# Patient Record
Sex: Female | Born: 1950 | Race: White | Hispanic: No | Marital: Married | Smoking: Never smoker
Health system: Southern US, Community
[De-identification: ages and names within clinical notes are randomized; demographics above are authoritative.]

## PROBLEM LIST (undated history)

## (undated) DIAGNOSIS — G43109 Migraine with aura, not intractable, without status migrainosus: Secondary | ICD-10-CM

## (undated) HISTORY — DX: Migraine with aura, not intractable, without status migrainosus: G43.109

## (undated) HISTORY — PX: CHOLECYSTECTOMY: SHX55

## (undated) HISTORY — PX: BACK SURGERY: SHX140

---

## 1983-01-15 HISTORY — PX: TUBAL LIGATION: SHX77

## 1999-11-07 ENCOUNTER — Encounter: Admission: RE | Admit: 1999-11-07 | Discharge: 1999-11-07 | Payer: Self-pay

## 1999-11-12 ENCOUNTER — Encounter: Admission: RE | Admit: 1999-11-12 | Discharge: 1999-11-12 | Payer: Self-pay

## 2001-01-01 ENCOUNTER — Encounter: Admission: RE | Admit: 2001-01-01 | Discharge: 2001-01-01 | Payer: Self-pay

## 2002-02-22 ENCOUNTER — Encounter: Admission: RE | Admit: 2002-02-22 | Discharge: 2002-02-22 | Payer: Self-pay

## 2003-03-28 ENCOUNTER — Encounter: Admission: RE | Admit: 2003-03-28 | Discharge: 2003-03-28 | Payer: Self-pay | Admitting: Obstetrics & Gynecology

## 2004-10-08 ENCOUNTER — Encounter: Admission: RE | Admit: 2004-10-08 | Discharge: 2004-10-08 | Payer: Self-pay | Admitting: Obstetrics & Gynecology

## 2005-10-16 ENCOUNTER — Encounter: Admission: RE | Admit: 2005-10-16 | Discharge: 2005-10-16 | Payer: Self-pay | Admitting: Obstetrics & Gynecology

## 2006-12-02 ENCOUNTER — Encounter: Admission: RE | Admit: 2006-12-02 | Discharge: 2006-12-02 | Payer: Self-pay | Admitting: Obstetrics & Gynecology

## 2007-12-03 ENCOUNTER — Encounter: Admission: RE | Admit: 2007-12-03 | Discharge: 2007-12-03 | Payer: Self-pay | Admitting: Obstetrics & Gynecology

## 2008-06-08 ENCOUNTER — Encounter: Admission: RE | Admit: 2008-06-08 | Discharge: 2008-06-08 | Payer: Self-pay | Admitting: Obstetrics & Gynecology

## 2008-12-05 ENCOUNTER — Encounter: Admission: RE | Admit: 2008-12-05 | Discharge: 2008-12-05 | Payer: Self-pay | Admitting: Obstetrics & Gynecology

## 2010-01-04 ENCOUNTER — Encounter
Admission: RE | Admit: 2010-01-04 | Discharge: 2010-01-04 | Payer: Self-pay | Source: Home / Self Care | Attending: Obstetrics & Gynecology | Admitting: Obstetrics & Gynecology

## 2010-02-04 ENCOUNTER — Encounter: Payer: Self-pay | Admitting: Obstetrics & Gynecology

## 2011-02-20 ENCOUNTER — Other Ambulatory Visit: Payer: Self-pay | Admitting: Obstetrics & Gynecology

## 2011-02-20 DIAGNOSIS — Z1231 Encounter for screening mammogram for malignant neoplasm of breast: Secondary | ICD-10-CM

## 2011-03-05 ENCOUNTER — Ambulatory Visit
Admission: RE | Admit: 2011-03-05 | Discharge: 2011-03-05 | Disposition: A | Discharge status: Home / Self Care | Payer: 59 | Source: Ambulatory Visit | Attending: Obstetrics & Gynecology | Admitting: Obstetrics & Gynecology

## 2011-03-05 DIAGNOSIS — Z1231 Encounter for screening mammogram for malignant neoplasm of breast: Secondary | ICD-10-CM

## 2012-03-04 ENCOUNTER — Other Ambulatory Visit: Payer: Self-pay | Admitting: Obstetrics & Gynecology

## 2012-03-04 DIAGNOSIS — Z1231 Encounter for screening mammogram for malignant neoplasm of breast: Secondary | ICD-10-CM

## 2012-03-31 ENCOUNTER — Ambulatory Visit: Payer: 59

## 2012-04-23 ENCOUNTER — Ambulatory Visit: Payer: 59

## 2012-05-18 ENCOUNTER — Ambulatory Visit: Payer: 59

## 2012-06-22 ENCOUNTER — Ambulatory Visit: Payer: 59

## 2012-07-21 ENCOUNTER — Ambulatory Visit
Admission: RE | Admit: 2012-07-21 | Discharge: 2012-07-21 | Disposition: A | Discharge status: Home / Self Care | Payer: 59 | Source: Ambulatory Visit | Attending: Obstetrics & Gynecology | Admitting: Obstetrics & Gynecology

## 2012-07-21 DIAGNOSIS — Z1231 Encounter for screening mammogram for malignant neoplasm of breast: Secondary | ICD-10-CM

## 2014-02-14 ENCOUNTER — Other Ambulatory Visit: Payer: Self-pay

## 2014-02-14 DIAGNOSIS — Z1231 Encounter for screening mammogram for malignant neoplasm of breast: Secondary | ICD-10-CM

## 2014-02-16 ENCOUNTER — Ambulatory Visit
Admission: RE | Admit: 2014-02-16 | Discharge: 2014-02-16 | Disposition: A | Discharge status: Home / Self Care | Payer: 59 | Source: Ambulatory Visit

## 2014-02-16 DIAGNOSIS — Z1231 Encounter for screening mammogram for malignant neoplasm of breast: Secondary | ICD-10-CM

## 2015-08-01 ENCOUNTER — Other Ambulatory Visit: Payer: Self-pay | Admitting: Obstetrics and Gynecology

## 2015-08-01 DIAGNOSIS — Z1231 Encounter for screening mammogram for malignant neoplasm of breast: Secondary | ICD-10-CM

## 2015-08-08 ENCOUNTER — Ambulatory Visit
Admission: RE | Admit: 2015-08-08 | Discharge: 2015-08-08 | Disposition: A | Discharge status: Home / Self Care | Payer: 59 | Source: Ambulatory Visit | Attending: Obstetrics and Gynecology | Admitting: Obstetrics and Gynecology

## 2015-08-08 DIAGNOSIS — Z1231 Encounter for screening mammogram for malignant neoplasm of breast: Secondary | ICD-10-CM

## 2016-11-11 ENCOUNTER — Other Ambulatory Visit: Payer: Self-pay | Admitting: Obstetrics and Gynecology

## 2016-11-11 DIAGNOSIS — Z1231 Encounter for screening mammogram for malignant neoplasm of breast: Secondary | ICD-10-CM

## 2016-12-03 ENCOUNTER — Ambulatory Visit: Payer: 59

## 2016-12-09 ENCOUNTER — Ambulatory Visit: Payer: 59

## 2016-12-09 ENCOUNTER — Ambulatory Visit
Admission: RE | Admit: 2016-12-09 | Discharge: 2016-12-09 | Disposition: A | Discharge status: Home / Self Care | Payer: 59 | Source: Ambulatory Visit | Attending: Obstetrics and Gynecology | Admitting: Obstetrics and Gynecology

## 2016-12-09 DIAGNOSIS — Z1231 Encounter for screening mammogram for malignant neoplasm of breast: Secondary | ICD-10-CM

## 2017-11-17 ENCOUNTER — Other Ambulatory Visit: Payer: Self-pay | Admitting: Emergency Medicine

## 2017-11-17 DIAGNOSIS — Z1231 Encounter for screening mammogram for malignant neoplasm of breast: Secondary | ICD-10-CM

## 2017-12-29 ENCOUNTER — Ambulatory Visit: Payer: 59

## 2018-02-02 ENCOUNTER — Ambulatory Visit
Admission: RE | Admit: 2018-02-02 | Discharge: 2018-02-02 | Disposition: A | Discharge status: Home / Self Care | Payer: Medicare HMO | Source: Ambulatory Visit | Attending: Emergency Medicine | Admitting: Emergency Medicine

## 2018-02-02 DIAGNOSIS — Z1231 Encounter for screening mammogram for malignant neoplasm of breast: Secondary | ICD-10-CM

## 2019-02-12 ENCOUNTER — Other Ambulatory Visit: Payer: Self-pay | Admitting: Emergency Medicine

## 2019-02-12 DIAGNOSIS — Z1231 Encounter for screening mammogram for malignant neoplasm of breast: Secondary | ICD-10-CM

## 2019-03-24 ENCOUNTER — Ambulatory Visit: Payer: Medicare HMO

## 2019-05-26 ENCOUNTER — Ambulatory Visit
Admission: RE | Admit: 2019-05-26 | Discharge: 2019-05-26 | Disposition: A | Discharge status: Home / Self Care | Payer: Medicare HMO | Source: Ambulatory Visit | Attending: Emergency Medicine | Admitting: Emergency Medicine

## 2019-05-26 ENCOUNTER — Other Ambulatory Visit: Payer: Self-pay

## 2019-05-26 DIAGNOSIS — Z1231 Encounter for screening mammogram for malignant neoplasm of breast: Secondary | ICD-10-CM

## 2020-03-20 ENCOUNTER — Other Ambulatory Visit: Payer: Self-pay

## 2020-03-20 ENCOUNTER — Ambulatory Visit (INDEPENDENT_AMBULATORY_CARE_PROVIDER_SITE_OTHER): Payer: Medicare HMO | Admitting: Allergy and Immunology

## 2020-03-20 ENCOUNTER — Encounter: Payer: Self-pay | Admitting: Allergy and Immunology

## 2020-03-20 ENCOUNTER — Encounter (INDEPENDENT_AMBULATORY_CARE_PROVIDER_SITE_OTHER): Payer: Self-pay

## 2020-03-20 VITALS — BP 142/82 | HR 76 | Resp 16 | Ht 69.4 in | Wt 202.2 lb

## 2020-03-20 DIAGNOSIS — K219 Gastro-esophageal reflux disease without esophagitis: Secondary | ICD-10-CM

## 2020-03-20 DIAGNOSIS — J301 Allergic rhinitis due to pollen: Secondary | ICD-10-CM | POA: Diagnosis not present

## 2020-03-20 DIAGNOSIS — J3089 Other allergic rhinitis: Secondary | ICD-10-CM

## 2020-03-20 MED ORDER — OMEPRAZOLE 40 MG PO CPDR
DELAYED_RELEASE_CAPSULE | ORAL | 5 refills | Status: AC
Start: 2020-03-20 — End: ?

## 2020-03-20 NOTE — Patient Instructions (Addendum)
  1.  Allergen avoidance measures - dust mite, cat, dog, pollens  2.  Consider a course of immunotherapy  3.  Treat and prevent inflammation:   A. OTC Flonase Sensimist -1 spray each nostril 1 time per day  4.  Treat and prevent reflux/LPR:   A.  Consolidate caffeine and chocolate as much as possible  B.  Omeprazole 40 mg - 1 tablet 2 times per day  5.  If needed:   A.  Nasal ipratropium 0.06% -2 sprays each nostril every 6 hours  B. Cetirizine 10 mg - 1 tablet 1 time per day  6.  Return to clinic in 4 weeks or earlier if problem

## 2020-03-20 NOTE — Progress Notes (Signed)
[place] - [place] - [place] - [place] - [place]   NEW PATIENT NOTE  Referring Provider: No ref. provider found Primary Provider: Venetia Constable, MD Date of office visit: 03/20/2020    Subjective:   Chief Complaint:  Adriana Perez (DOB: 11-Dec-1950) is a 70 y.o. female who presents to the clinic on 03/20/2020 with a chief complaint of Allergic Rhinitis  .  HPI: Adriana Perez presents to this clinic in evaluation of several issues.  She has a long history of nasal congestion and occasional sneezing and lots of drainage in her nose.  She has been under the care of ENT for many years and it was suggested that she consider a course of immunotherapy.  Because of the logistical issue she has come to our clinic to be skin tested anticipation of starting this form of treatment.  She has lots of mucus in her throat and she has throat clearing.  She has lots of coughing after eating.  Apparently she has had a barium swallow which did not identify any significant abnormality.  She has apparently been treated with omeprazole in the past although she cannot remember the details of that issue.  Now she uses omeprazole just a few times per month whenever she has "bad heartburn" that comes up into her mid chest with discomfort.  She drinks tea about 3 times per week and has chocolate about twice a week and has red wine about 1 time per week.  Currently she had a "cold" that started about 14 days ago.  She just finished a course of antibiotics which really did not affect this issue.  She has been given nasal ipratropium which she uses about twice a day to dry up her nose which she does think helps with some of the mucus production that goes down her throat but she still continues to have problems with her throat.  She relates a history of being stung by approximately 200 yellow jackets on a single occasion over a decade ago.  She did not have any immediate reaction but later that night she  became somewhat sick with what sounds like a toxic reaction to the venom.  She has obtained 2 Moderna Covid vaccines.  Past Medical History:  Diagnosis Date  . Ocular migraine     Past Surgical History:  Procedure Laterality Date  . BACK SURGERY     Lumbar  . CESAREAN SECTION  1985  . CHOLECYSTECTOMY    . TUBAL LIGATION  1985    Allergies as of 03/20/2020      Reactions   Penicillins Shortness Of Breath, Rash      Medication List      ASPIRIN 81 PO Take by mouth daily.   bisoprolol-hydrochlorothiazide 2.5-6.25 MG tablet Commonly known as: ZIAC Take 0.5 tablets by mouth daily.   cetirizine 10 MG tablet Commonly known as: ZYRTEC Take 10 mg by mouth daily.   COQ10 PO Take by mouth daily.   doxycycline 100 MG tablet Commonly known as: VIBRA-TABS Take 100 mg by mouth 2 (two) times daily.   ipratropium 0.06 % nasal spray Commonly known as: ATROVENT Place 2 sprays into both nostrils 3 (three) times daily.   multivitamin tablet Take 1 tablet by mouth daily.   phentermine 37.5 MG tablet Commonly known as: ADIPEX-P Take 37.5 mg by mouth daily.   RED YEAST RICE PO Take by mouth.   VITAMIN B-12 PO Take 5,000 mcg by mouth daily.   VITAMIN D3 PO  Take by mouth daily.       Review of systems negative except as noted in HPI / PMHx or noted below:  Review of Systems  Constitutional: Negative.   HENT: Negative.   Eyes: Negative.   Respiratory: Negative.   Cardiovascular: Negative.   Gastrointestinal: Negative.   Genitourinary: Negative.   Musculoskeletal: Negative.   Skin: Negative.   Neurological: Negative.   Endo/Heme/Allergies: Negative.   Psychiatric/Behavioral: Negative.     Family History  Problem Relation Age of Onset  . Dementia Mother   . COPD Father   . Atrial fibrillation Father   . Heart attack Maternal Grandmother   . Heart attack Maternal Grandfather   . Heart attack Paternal Grandmother   . Heart attack Paternal Grandfather      Social History   Socioeconomic History  . Marital status: Married    Spouse name: Not on file  . Number of children: Not on file  . Years of education: Not on file  . Highest education level: Not on file  Occupational History  . Not on file  Tobacco Use  . Smoking status: Never Smoker  . Smokeless tobacco: Never Used  Substance and Sexual Activity  . Alcohol use: Not Currently  . Drug use: Never  . Sexual activity: Not on file  Other Topics Concern  . Not on file  Social History Narrative  . Not on file   Environmental and Social history  Lives in a house with a dry environment, no animals located inside the household, carpet in the bedroom, plastic on the bed, no plastic on the pillow, and no smoking ongoing with inside the household.  Objective:   Vitals:   03/20/20 0956  BP: (!) 142/82  Pulse: 76  Resp: 16  SpO2: 96%   Height: 5' 9.4" (176.3 cm) Weight: 202 lb 3.2 oz (91.7 kg)  Physical Exam Constitutional:      Appearance: She is not diaphoretic.  HENT:     Head: Normocephalic.     Right Ear: Tympanic membrane, ear canal and external ear normal.     Left Ear: Tympanic membrane, ear canal and external ear normal.     Nose: Nose normal. No mucosal edema or rhinorrhea.     Mouth/Throat:     Mouth: Oropharynx is clear and moist and mucous membranes are normal.     Pharynx: Uvula midline. No oropharyngeal exudate.  Eyes:     Conjunctiva/sclera: Conjunctivae normal.  Neck:     Thyroid: No thyromegaly.     Trachea: Trachea normal. No tracheal tenderness or tracheal deviation.  Cardiovascular:     Rate and Rhythm: Normal rate and regular rhythm.     Heart sounds: Normal heart sounds, S1 normal and S2 normal. No murmur heard.   Pulmonary:     Effort: No respiratory distress.     Breath sounds: Normal breath sounds. No stridor. No wheezing or rales.  Musculoskeletal:        General: No edema.  Lymphadenopathy:     Head:     Right side of head: No  tonsillar adenopathy.     Left side of head: No tonsillar adenopathy.     Cervical: No cervical adenopathy.  Skin:    Findings: No erythema or rash.     Nails: There is no clubbing.  Neurological:     Mental Status: She is alert.     Diagnostics: Allergy skin tests were performed.  She demonstrated hypersensitivity to dust mite, cat, dog, grass,  weed, tree.  Assessment and Plan:    1. Perennial allergic rhinitis   2. Seasonal allergic rhinitis due to pollen   3. LPRD (laryngopharyngeal reflux disease)     1.  Allergen avoidance measures - dust mite, cat, dog, pollens  2.  Consider a course of immunotherapy  3.  Treat and prevent inflammation:   A. OTC Flonase Sensimist -1 spray each nostril 1 time per day  4.  Treat and prevent reflux/LPR:   A.  Consolidate caffeine and chocolate as much as possible  B.  Omeprazole 40 mg - 1 tablet 2 times per day  5.  If needed:   A.  Nasal ipratropium 0.06% -2 sprays each nostril every 6 hours  B. Cetirizine 10 mg - 1 tablet 1 time per day  6.  Return to clinic in 4 weeks or earlier if problem  It sounds as though Pammy has a combination of atopic respiratory disease and LPR and I am going to treat her with the therapy noted above which includes allergen avoidance measures, the use of a anti-inflammatory nasal steroid on a preventative basis, and a more aggressive therapy directed against her reflux.  I will see her back in this clinic in 4 weeks or earlier if there is a problem.  Laurette Schimke, MD Allergy / Immunology [facility]

## 2020-03-21 ENCOUNTER — Encounter: Payer: Self-pay | Admitting: Allergy and Immunology

## 2020-04-17 ENCOUNTER — Ambulatory Visit: Payer: Medicare HMO | Admitting: Allergy and Immunology

## 2020-04-17 ENCOUNTER — Other Ambulatory Visit: Payer: Self-pay

## 2020-04-17 ENCOUNTER — Encounter: Payer: Self-pay | Admitting: Allergy and Immunology

## 2020-04-17 VITALS — BP 140/86 | HR 97 | Resp 16

## 2020-04-17 DIAGNOSIS — K219 Gastro-esophageal reflux disease without esophagitis: Secondary | ICD-10-CM

## 2020-04-17 DIAGNOSIS — J3089 Other allergic rhinitis: Secondary | ICD-10-CM | POA: Diagnosis not present

## 2020-04-17 DIAGNOSIS — J301 Allergic rhinitis due to pollen: Secondary | ICD-10-CM | POA: Diagnosis not present

## 2020-04-17 NOTE — Patient Instructions (Addendum)
  1.  Continue to perform allergen avoidance measures - dust mite, cat, dog, pollens  2.  Continue to treat and prevent inflammation:   A. OTC Flonase Sensimist -1 spray each nostril 1 time per day  3.  Continue to treat and prevent reflux/LPR:   A. Omeprazole 40 mg - 1 tablet 1 time per day  4.  If needed:   A.  Nasal ipratropium 0.06% -2 sprays each nostril every 6 hours  B. Cetirizine 10 mg - 1 tablet 1 time per day  5. Call clinic with update in 8 weeks. Taper???  6.  Return to clinic in 6 months or earlier if problem

## 2020-04-17 NOTE — Progress Notes (Signed)
[place] - [place] - [place] - [place] - [place]   Follow-up Note  Referring Provider: Hollace Kinnier* Primary Provider: Venetia Constable, MD Date of Office Visit: 04/17/2020  Subjective:   Adriana Perez (DOB: Jul 30, 1950) is a 70 y.o. female who [facility] in re-evaluation of the following:  HPI: Amaya returns to this clinic in evaluation of allergic rhinoconjunctivitis and LPR.  Her last visit to this clinic was her initial evaluation of 20 March 2020.  She has noticed a very significant improvement regarding all of the mucus in her throat and her throat clearing and she does not cough after eating and she has very little issues with sneezing.  Overall she is very satisfied with the response that she has received on her current therapy which includes treatment directed against respiratory tract inflammation and LPR.  She did have an issue using omeprazole twice a day.  They apparently created some sternal sensation as though it was heartburn.  At 1 time per day use she does not have any issues.  Allergies as of 04/17/2020      Reactions   Penicillins Shortness Of Breath, Rash      Medication List      ASPIRIN 81 PO Take by mouth daily.   bisoprolol-hydrochlorothiazide 2.5-6.25 MG tablet Commonly known as: ZIAC Take 0.5 tablets by mouth daily.   cetirizine 10 MG tablet Commonly known as: ZYRTEC Take 10 mg by mouth daily.   COQ10 PO Take by mouth daily.   doxycycline 100 MG tablet Commonly known as: VIBRA-TABS Take 100 mg by mouth 2 (two) times daily.   ipratropium 0.06 % nasal spray Commonly known as: ATROVENT Place 2 sprays into both nostrils 3 (three) times daily.   multivitamin tablet Take 1 tablet by mouth daily.   omeprazole 40 MG capsule Commonly known as: PRILOSEC Take one capsule by mouth twice daily.   phentermine 37.5 MG tablet Commonly known as: ADIPEX-P Take 37.5 mg by mouth  daily.   RED YEAST RICE PO Take by mouth.   VITAMIN B-12 PO Take 5,000 mcg by mouth daily.   VITAMIN D3 PO Take by mouth daily.       Past Medical History:  Diagnosis Date  . Ocular migraine     Past Surgical History:  Procedure Laterality Date  . BACK SURGERY     Lumbar  . CESAREAN SECTION  1985  . CHOLECYSTECTOMY    . TUBAL LIGATION  1985    Review of systems negative except as noted in HPI / PMHx or noted below:  Review of Systems  Constitutional: Negative.   HENT: Negative.   Eyes: Negative.   Respiratory: Negative.   Cardiovascular: Negative.   Gastrointestinal: Negative.   Genitourinary: Negative.   Musculoskeletal: Negative.   Skin: Negative.   Neurological: Negative.   Endo/Heme/Allergies: Negative.   Psychiatric/Behavioral: Negative.      Objective:   Vitals:   04/17/20 1608  BP: 140/86  Pulse: 97  Resp: 16  SpO2: 96%          Physical Exam Constitutional:      Appearance: She is not diaphoretic.  HENT:     Head: Normocephalic.     Right Ear: Tympanic membrane, ear canal and external ear normal.     Left Ear: Tympanic membrane, ear canal and external ear normal.     Nose: Nose normal. No mucosal edema or rhinorrhea.     Mouth/Throat:  Pharynx: Uvula midline. No oropharyngeal exudate.  Eyes:     Conjunctiva/sclera: Conjunctivae normal.  Neck:     Thyroid: No thyromegaly.     Trachea: Trachea normal. No tracheal tenderness or tracheal deviation.  Cardiovascular:     Rate and Rhythm: Normal rate and regular rhythm.     Heart sounds: Normal heart sounds, S1 normal and S2 normal. No murmur heard.   Pulmonary:     Effort: No respiratory distress.     Breath sounds: Normal breath sounds. No stridor. No wheezing or rales.  Lymphadenopathy:     Head:     Right side of head: No tonsillar adenopathy.     Left side of head: No tonsillar adenopathy.     Cervical: No cervical adenopathy.  Skin:    Findings: No erythema or rash.      Nails: There is no clubbing.  Neurological:     Mental Status: She is alert.     Diagnostics: none  Assessment and Plan:   1. Perennial allergic rhinitis   2. Seasonal allergic rhinitis due to pollen   3. LPRD (laryngopharyngeal reflux disease)     1.  Continue to perform allergen avoidance measures - dust mite, cat, dog, pollens  2.  Continue to treat and prevent inflammation:   A. OTC Flonase Sensimist -1 spray each nostril 1 time per day  3.  Continue to treat and prevent reflux/LPR:   A. Omeprazole 40 mg - 1 tablet 1 time per day  4.  If needed:   A.  Nasal ipratropium 0.06% -2 sprays each nostril every 6 hours  B. Cetirizine 10 mg - 1 tablet 1 time per day  5. Call clinic with update in 8 weeks. Taper???  6.  Return to clinic in 6 months or earlier if problem  [name] is doing much better even though she is right in the middle of the pollination season.  I think that the her improvement comes with consistent use of her nasal steroid and her proton pump inhibitor to address the 2 major insults contributing to her respiratory tract issues.  We will keep her on this plan for 8 weeks.  That will be a total of 12 weeks of therapy and I have asked her to contact me by telephone with an update regarding her response and we may be able to consolidate some of her treatment with that visit.  If she fails medical therapy she would be a candidate for immunotherapy.   Laurette Schimke, MD Allergy / Immunology [facility]

## 2020-04-18 ENCOUNTER — Encounter: Payer: Self-pay | Admitting: Allergy and Immunology

## 2020-07-26 ENCOUNTER — Other Ambulatory Visit: Payer: Self-pay | Admitting: Obstetrics and Gynecology

## 2020-07-26 DIAGNOSIS — Z1231 Encounter for screening mammogram for malignant neoplasm of breast: Secondary | ICD-10-CM

## 2020-07-31 ENCOUNTER — Inpatient Hospital Stay: Admission: RE | Admit: 2020-07-31 | Payer: Medicare HMO | Source: Ambulatory Visit

## 2020-11-15 ENCOUNTER — Ambulatory Visit: Payer: Medicare HMO

## 2020-12-20 ENCOUNTER — Ambulatory Visit: Payer: Medicare HMO

## 2021-01-24 ENCOUNTER — Ambulatory Visit
Admission: RE | Admit: 2021-01-24 | Discharge: 2021-01-24 | Disposition: A | Discharge status: Home / Self Care | Payer: Medicare HMO | Source: Ambulatory Visit | Attending: Obstetrics and Gynecology | Admitting: Obstetrics and Gynecology

## 2021-01-24 DIAGNOSIS — Z1231 Encounter for screening mammogram for malignant neoplasm of breast: Secondary | ICD-10-CM

## 2022-03-12 IMAGING — MG MM DIGITAL SCREENING BILAT W/ TOMO AND CAD
8 series · 8 of 24 positions shown · non-contrast
Comparison: Previous exam(s).

CLINICAL DATA: Screening.

EXAM:
DIGITAL SCREENING BILATERAL MAMMOGRAM WITH TOMOSYNTHESIS AND CAD
TECHNIQUE: Bilateral screening digital craniocaudal and mediolateral oblique
mammograms were obtained. Bilateral screening digital breast
tomosynthesis was performed. The images were evaluated with
computer-aided detection.

[R CC synth-2D]
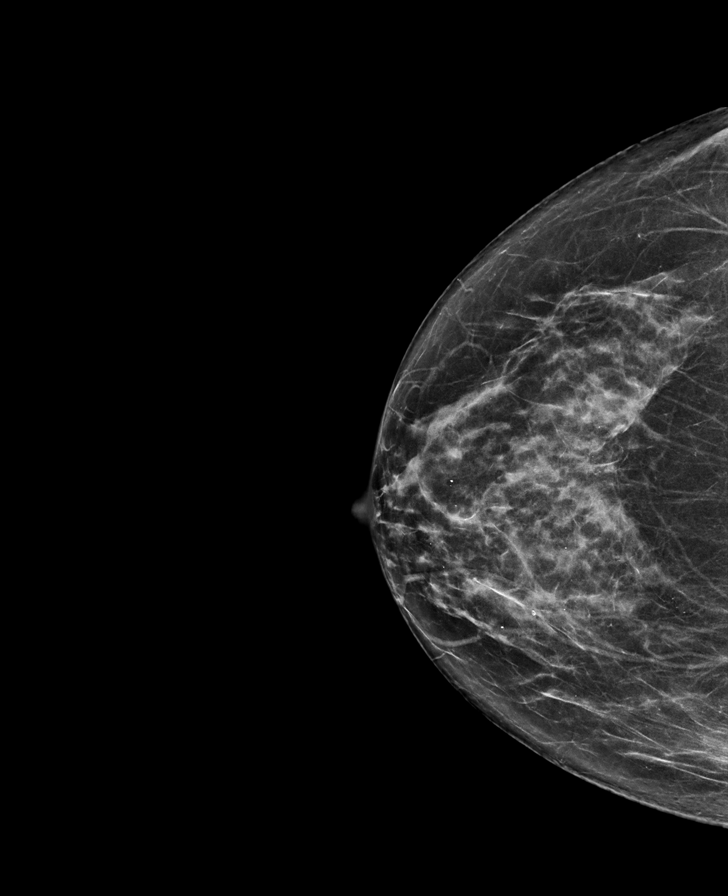

[L MLO synth-2D]
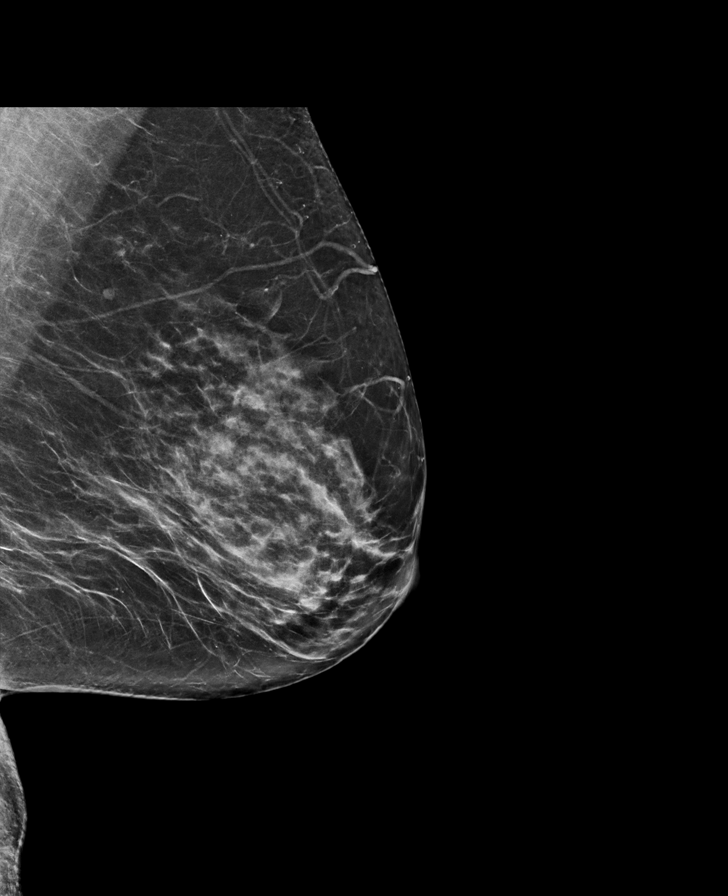

[R MLO synth-2D]
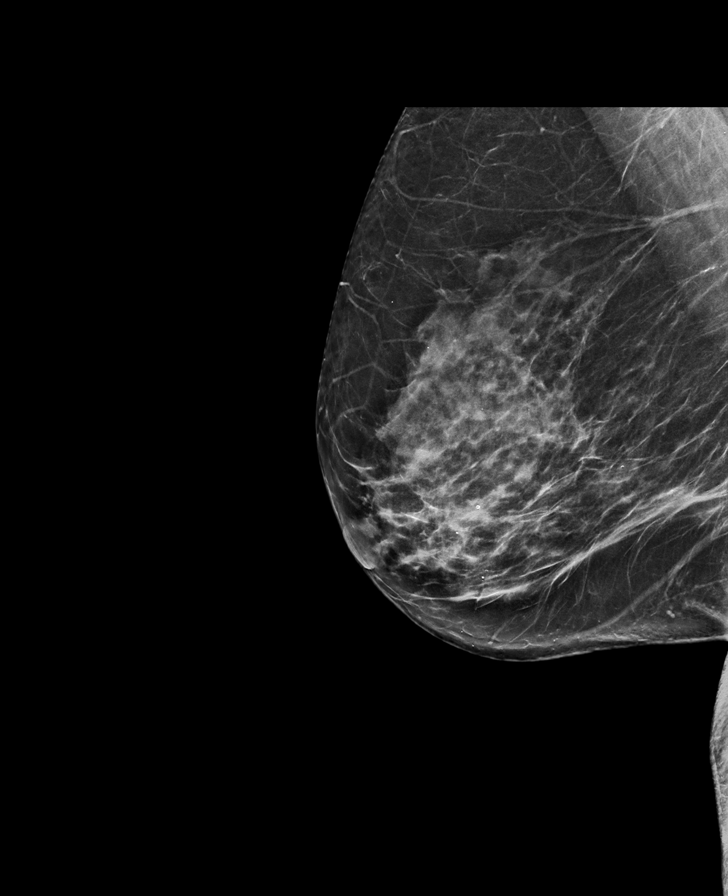

[L CC synth-2D]
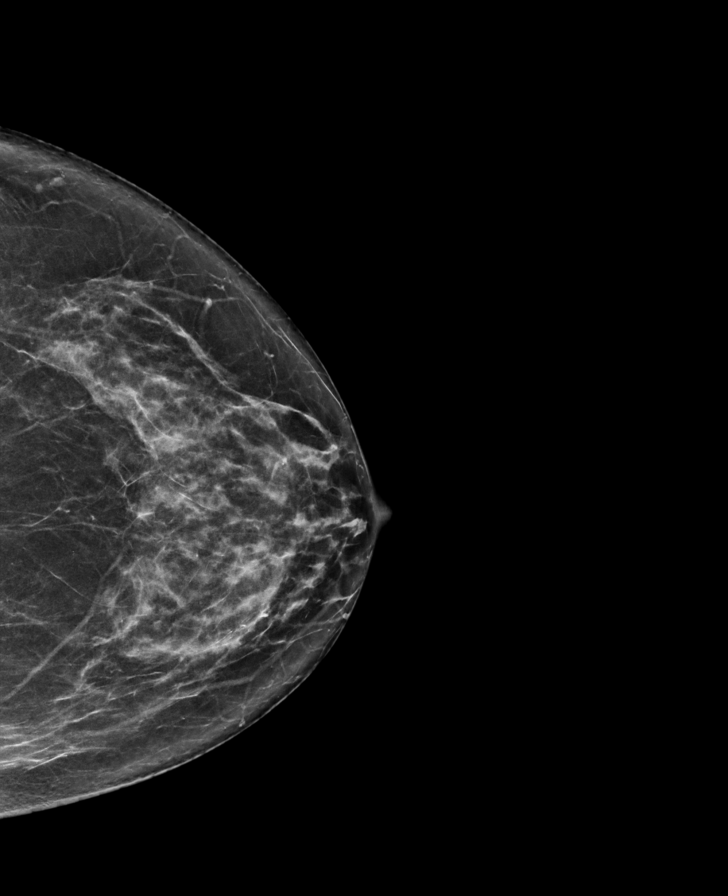

[R CC tomo · tomo slice 35/70.0]
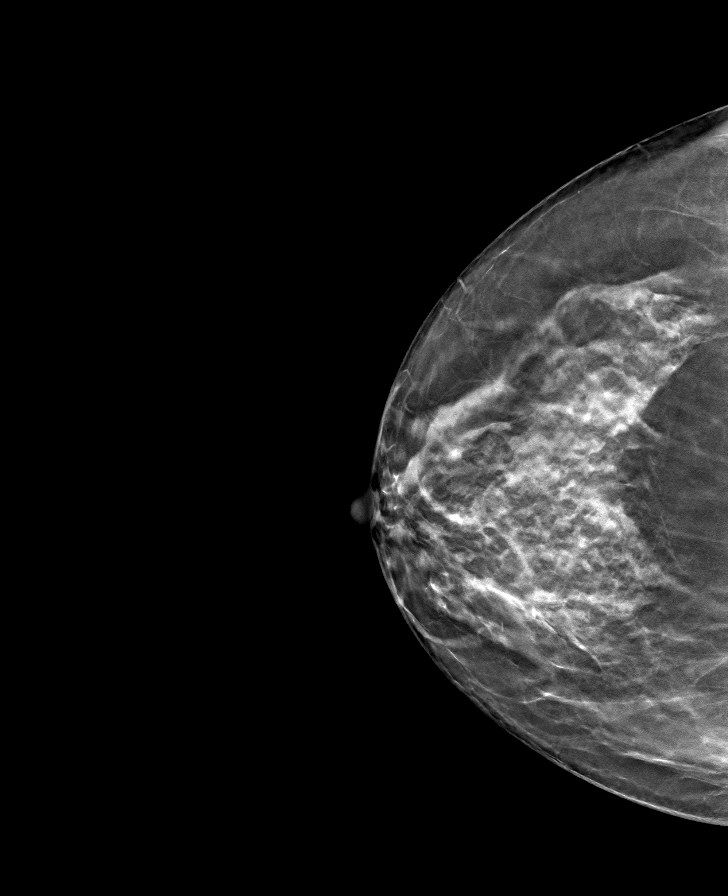

[L CC tomo · tomo slice 33/66.0]
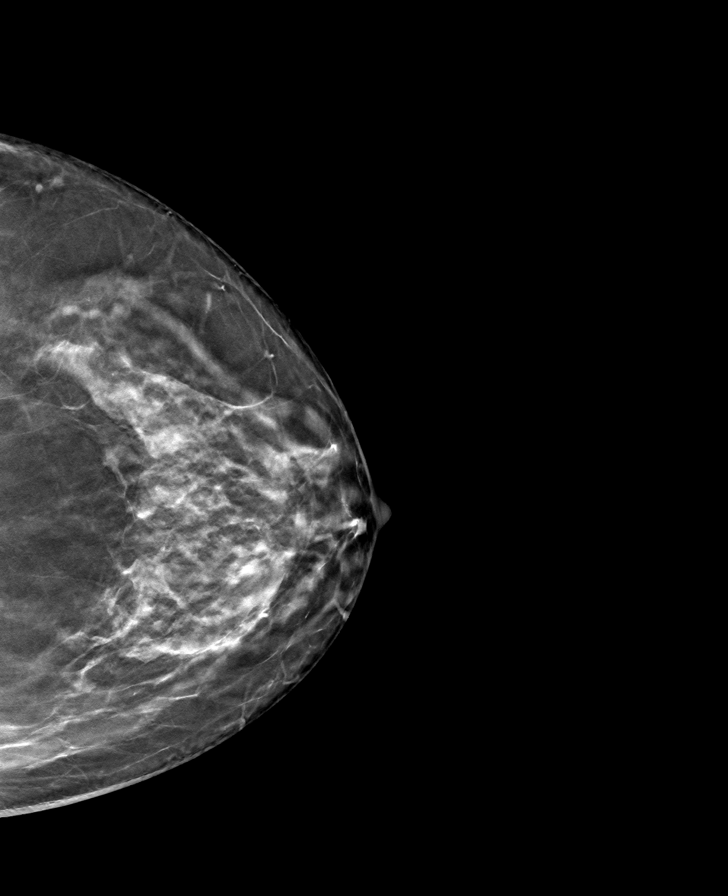

[L MLO tomo · tomo slice 38/75.0]
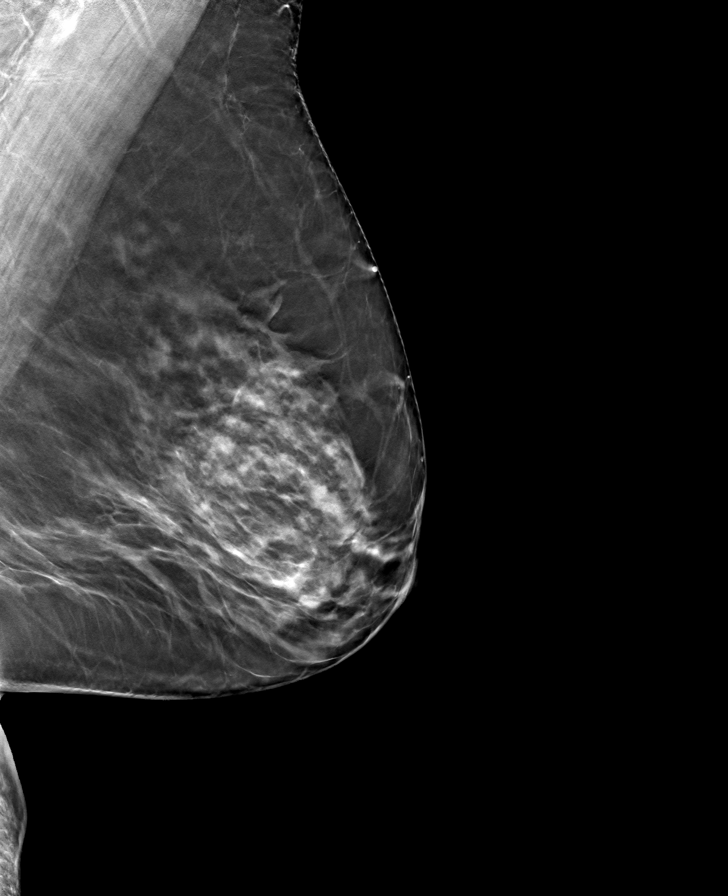

[R MLO tomo · tomo slice 39/76.0]
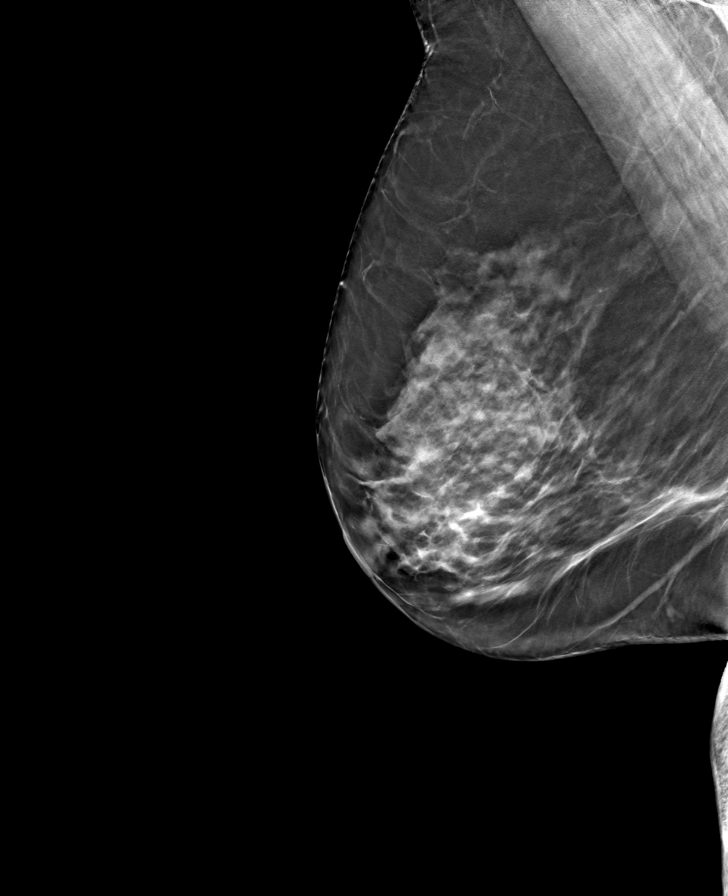

[8 of 24 positions shown; findings below may reference images not displayed]

ACR Breast Density Category c: The breast tissue is heterogeneously
dense, which may obscure small masses.
FINDINGS: There are no findings suspicious for malignancy.
IMPRESSION: No mammographic evidence of malignancy. A result letter of this
screening mammogram will be mailed directly to the patient.

RECOMMENDATION:
Screening mammogram in one year. (Code:Q3-W-BC3)

BI-RADS CATEGORY  1: Negative.

## 2022-07-02 ENCOUNTER — Other Ambulatory Visit: Payer: Self-pay | Admitting: Obstetrics and Gynecology

## 2022-07-02 DIAGNOSIS — Z1231 Encounter for screening mammogram for malignant neoplasm of breast: Secondary | ICD-10-CM

## 2022-07-05 ENCOUNTER — Ambulatory Visit
Admission: RE | Admit: 2022-07-05 | Discharge: 2022-07-05 | Disposition: A | Discharge status: Home / Self Care | Payer: Medicare HMO | Source: Ambulatory Visit | Attending: Obstetrics and Gynecology | Admitting: Obstetrics and Gynecology

## 2022-07-05 DIAGNOSIS — Z1231 Encounter for screening mammogram for malignant neoplasm of breast: Secondary | ICD-10-CM

## 2023-10-14 ENCOUNTER — Other Ambulatory Visit: Payer: Self-pay | Admitting: Obstetrics and Gynecology

## 2023-10-14 DIAGNOSIS — Z1231 Encounter for screening mammogram for malignant neoplasm of breast: Secondary | ICD-10-CM

## 2023-10-21 ENCOUNTER — Ambulatory Visit
Admission: RE | Admit: 2023-10-21 | Discharge: 2023-10-21 | Disposition: A | Source: Ambulatory Visit | Attending: Obstetrics and Gynecology | Admitting: Obstetrics and Gynecology

## 2023-10-21 DIAGNOSIS — Z1231 Encounter for screening mammogram for malignant neoplasm of breast: Secondary | ICD-10-CM

## 2023-11-24 ENCOUNTER — Ambulatory Visit: Admitting: Allergy and Immunology
